# Patient Record
Sex: Female | Born: 1965 | Race: Black or African American | Hispanic: No | Marital: Married | Smoking: Never smoker
Health system: Southern US, Community
[De-identification: ages and names within clinical notes are randomized; demographics above are authoritative.]

## PROBLEM LIST (undated history)

## (undated) DIAGNOSIS — I10 Essential (primary) hypertension: Secondary | ICD-10-CM

## (undated) DIAGNOSIS — E119 Type 2 diabetes mellitus without complications: Secondary | ICD-10-CM

---

## 2008-05-20 ENCOUNTER — Emergency Department (HOSPITAL_COMMUNITY): Admission: EM | Admit: 2008-05-20 | Discharge: 2008-05-21 | Payer: Self-pay | Admitting: Emergency Medicine

## 2010-08-21 ENCOUNTER — Emergency Department (HOSPITAL_COMMUNITY)
Admission: EM | Admit: 2010-08-21 | Discharge: 2010-08-21 | Payer: Self-pay | Source: Home / Self Care | Admitting: Emergency Medicine

## 2012-06-03 ENCOUNTER — Emergency Department (HOSPITAL_COMMUNITY)
Admission: EM | Admit: 2012-06-03 | Discharge: 2012-06-03 | Disposition: A | Discharge status: Home / Self Care | Payer: Self-pay | Attending: Emergency Medicine | Admitting: Emergency Medicine

## 2012-06-03 ENCOUNTER — Encounter (HOSPITAL_COMMUNITY): Payer: Self-pay | Admitting: *Deleted

## 2012-06-03 DIAGNOSIS — I10 Essential (primary) hypertension: Secondary | ICD-10-CM | POA: Insufficient documentation

## 2012-06-03 DIAGNOSIS — L600 Ingrowing nail: Secondary | ICD-10-CM | POA: Insufficient documentation

## 2012-06-03 HISTORY — DX: Essential (primary) hypertension: I10

## 2012-06-03 MED ORDER — HYDROCODONE-ACETAMINOPHEN 5-325 MG PO TABS: 1.0000 | ORAL_TABLET | ORAL | Status: AC | PRN

## 2012-06-03 MED ORDER — CIPROFLOXACIN HCL 250 MG PO TABS
500.0000 mg | ORAL_TABLET | Freq: Once | ORAL | Status: AC
Start: 2012-06-03 — End: 2012-06-03
  Administered 2012-06-03: 500 mg via ORAL
  Filled 2012-06-03: qty 2

## 2012-06-03 MED ORDER — CIPROFLOXACIN HCL 500 MG PO TABS: 500.0000 mg | ORAL_TABLET | Freq: Two times a day (BID) | ORAL | Status: AC

## 2012-06-03 MED ORDER — IBUPROFEN 800 MG PO TABS
800.0000 mg | ORAL_TABLET | Freq: Once | ORAL | Status: AC
  Administered 2012-06-03: 800 mg via ORAL
  Filled 2012-06-03: qty 1

## 2012-06-03 MED ORDER — DOUBLE ANTIBIOTIC 500-10000 UNIT/GM EX OINT
TOPICAL_OINTMENT | Freq: Once | CUTANEOUS | Status: DC
Start: 2012-06-03 — End: 2012-06-04

## 2012-06-03 MED ORDER — LIDOCAINE HCL (PF) 2 % IJ SOLN
INTRAMUSCULAR | Status: AC
  Administered 2012-06-03: 10 mL
  Filled 2012-06-03: qty 10

## 2012-06-03 MED ORDER — BACITRACIN ZINC 500 UNIT/GM EX OINT
TOPICAL_OINTMENT | CUTANEOUS | Status: AC
  Administered 2012-06-03: 1
  Filled 2012-06-03: qty 0.9

## 2012-06-03 MED ORDER — LIDOCAINE HCL (PF) 2 % IJ SOLN
10.0000 mL | Freq: Once | INTRAMUSCULAR | Status: AC
  Administered 2012-06-03: 10 mL

## 2012-06-03 MED ORDER — ONDANSETRON HCL 4 MG PO TABS
4.0000 mg | ORAL_TABLET | Freq: Once | ORAL | Status: AC
  Administered 2012-06-03: 4 mg via ORAL
  Filled 2012-06-03: qty 1

## 2012-06-03 MED ORDER — HYDROCODONE-ACETAMINOPHEN 5-325 MG PO TABS
2.0000 | ORAL_TABLET | Freq: Once | ORAL | Status: AC
  Administered 2012-06-03: 2 via ORAL
  Filled 2012-06-03: qty 2

## 2012-06-03 NOTE — ED Notes (Signed)
Denies any injury to toe

## 2012-06-03 NOTE — ED Provider Notes (Signed)
History     CSN: [id number]  Arrival date & time 06/03/12  1810   First MD Initiated Contact with Patient 06/03/12 2106      Chief Complaint  Patient presents with  . Toe Pain    (Consider location/radiation/quality/duration/timing/severity/associated sxs/prior treatment) Patient is a 46 y.o. female presenting with toe pain. The history is provided by the patient.  Toe Pain This is a new problem. The current episode started in the past 7 days. The problem occurs daily. The problem has been gradually worsening. Pertinent negatives include no abdominal pain, arthralgias, chest pain, chills, coughing, fever, neck pain or numbness. The symptoms are aggravated by standing and walking. She has tried nothing for the symptoms. The treatment provided no relief.    Past Medical History  Diagnosis Date  . Hypertension     No past surgical history on file.  No family history on file.  History  Substance Use Topics  . Smoking status: Never Smoker   . Smokeless tobacco: Not on file  . Alcohol Use: Yes     Comment: occasional    OB History    Grav Para Term Preterm Abortions TAB SAB Ect Mult Living                  Review of Systems  Constitutional: Negative for fever, chills and activity change.       All ROS Neg except as noted in HPI  HENT: Negative for nosebleeds and neck pain.   Eyes: Negative for photophobia and discharge.  Respiratory: Negative for cough, shortness of breath and wheezing.   Cardiovascular: Negative for chest pain and palpitations.  Gastrointestinal: Negative for abdominal pain and blood in stool.  Genitourinary: Negative for dysuria, frequency and hematuria.  Musculoskeletal: Negative for back pain and arthralgias.  Skin: Negative.   Neurological: Negative for dizziness, seizures, speech difficulty and numbness.  Psychiatric/Behavioral: Negative for hallucinations and confusion.    Allergies  Amoxicillin and Tomato  Home Medications   Current  Outpatient Rx  Name  Route  Sig  Dispense  Refill  . ASPIRIN-ACETAMINOPHEN-CAFFEINE 250-250-65 MG PO TABS   Oral   Take 2 tablets by mouth once as needed. For pain           BP 163/98  Pulse 73  Temp 98.2 F (36.8 C) (Oral)  Resp 20  Ht 5\' 2"  (1.575 m)  Wt 198 lb (89.812 kg)  BMI 36.21 kg/m2  SpO2 100%  LMP 05/03/2012  Physical Exam  Nursing note and vitals reviewed. Constitutional: She is oriented to person, place, and time. She appears well-developed and well-nourished.  Non-toxic appearance.  HENT:  Head: Normocephalic.  Right Ear: Tympanic membrane and external ear normal.  Left Ear: Tympanic membrane and external ear normal.  Eyes: EOM and lids are normal. Pupils are equal, round, and reactive to light.  Neck: Normal range of motion. Neck supple. Carotid bruit is not present.  Cardiovascular: Normal rate, regular rhythm, normal heart sounds, intact distal pulses and normal pulses.   Pulmonary/Chest: Breath sounds normal. No respiratory distress.  Abdominal: Soft. Bowel sounds are normal. There is no tenderness. There is no guarding.  Musculoskeletal: Normal range of motion.       Tenderness and swelling of the medial right 1st toe. Ingrown nail noted. No drainage. No red steaks.  Lymphadenopathy:       Head (right side): No submandibular adenopathy present.       Head (left side): No submandibular adenopathy present.  She has no cervical adenopathy.  Neurological: She is alert and oriented to person, place, and time. She has normal strength. No cranial nerve deficit or sensory deficit.  Skin: Skin is warm and dry.  Psychiatric: She has a normal mood and affect. Her speech is normal.    ED Course  Procedures : REMOVAL OF INGROWN TOE NAIL - patient identified by arm band. Permission for the procedure is given by the patient. Procedural time out taken before removal of ingrown toenail of the right great toe. The right great toe was painted with alcohol. It was then  painted with Betadine. A digital block was carried out with 2% plain lidocaine. Following this using sterile technique the ingrown portion of the nail was removed with forceps, needle holder, and scissors. The site was washed out. There was minimal pus like material present. The patient had a Neosporin dressing applied. And the patient was fitted with a postop shoe. The patient tolerated the procedure without problem.  Labs Reviewed - No data to display No results found. Pulse Ox 100% on RA. WNL by my interpretation.  No diagnosis found.    MDM  I have reviewed nursing notes, vital signs, and all appropriate lab and imaging results for this patient. Pt had an ingrown nail with mild infection. Ingrown nail removed.  Pt placed on cipro  And Norco for pain. Pt to return if not improving.       Kathie Dike, [place] 06/04/12 [phone]

## 2012-06-03 NOTE — ED Notes (Signed)
Pt with pain to right great toe x 1 week, pain has gotten worse per pt., possible ingrown toe nail

## 2012-06-03 NOTE — ED Notes (Signed)
Pain to right great toe x1 week, pt denies injury, swelling and inflammation noted to inside of right great toe, pt denies drainage. No other complaints voiced.

## 2012-06-04 NOTE — ED Provider Notes (Signed)
Medical screening examination/treatment/procedure(s) were performed by non-physician practitioner and as supervising physician I was immediately available for consultation/collaboration.   Glynn Octave, MD 06/04/12 [phone]

## 2016-11-14 ENCOUNTER — Emergency Department (HOSPITAL_COMMUNITY)
Admission: EM | Admit: 2016-11-14 | Discharge: 2016-11-14 | Disposition: A | Discharge status: Home / Self Care | Payer: Self-pay | Attending: Emergency Medicine | Admitting: Emergency Medicine

## 2016-11-14 ENCOUNTER — Emergency Department (HOSPITAL_COMMUNITY): Payer: Self-pay

## 2016-11-14 ENCOUNTER — Encounter (HOSPITAL_COMMUNITY): Payer: Self-pay | Admitting: *Deleted

## 2016-11-14 DIAGNOSIS — I10 Essential (primary) hypertension: Secondary | ICD-10-CM | POA: Insufficient documentation

## 2016-11-14 DIAGNOSIS — M150 Primary generalized (osteo)arthritis: Secondary | ICD-10-CM

## 2016-11-14 DIAGNOSIS — Z79899 Other long term (current) drug therapy: Secondary | ICD-10-CM | POA: Insufficient documentation

## 2016-11-14 DIAGNOSIS — M1991 Primary osteoarthritis, unspecified site: Secondary | ICD-10-CM | POA: Insufficient documentation

## 2016-11-14 DIAGNOSIS — G5601 Carpal tunnel syndrome, right upper limb: Secondary | ICD-10-CM | POA: Insufficient documentation

## 2016-11-14 DIAGNOSIS — M159 Polyosteoarthritis, unspecified: Secondary | ICD-10-CM

## 2016-11-14 DIAGNOSIS — E119 Type 2 diabetes mellitus without complications: Secondary | ICD-10-CM | POA: Insufficient documentation

## 2016-11-14 HISTORY — DX: Type 2 diabetes mellitus without complications: E11.9

## 2016-11-14 MED ORDER — KETOROLAC TROMETHAMINE 10 MG PO TABS
10.0000 mg | ORAL_TABLET | Freq: Once | ORAL | Status: AC
  Administered 2016-11-14: 10 mg via ORAL
  Filled 2016-11-14: qty 1

## 2016-11-14 MED ORDER — TRAMADOL HCL 50 MG PO TABS
100.0000 mg | ORAL_TABLET | Freq: Once | ORAL | Status: AC
  Administered 2016-11-14: 100 mg via ORAL
  Filled 2016-11-14: qty 2

## 2016-11-14 MED ORDER — PROMETHAZINE HCL 12.5 MG PO TABS
12.5000 mg | ORAL_TABLET | Freq: Once | ORAL | Status: AC
  Administered 2016-11-14: 12.5 mg via ORAL
  Filled 2016-11-14: qty 1

## 2016-11-14 MED ORDER — DICLOFENAC SODIUM 75 MG PO TBEC: 75.0000 mg | DELAYED_RELEASE_TABLET | Freq: Two times a day (BID) | ORAL | 0 refills | Status: AC

## 2016-11-14 MED ORDER — TRAMADOL HCL 50 MG PO TABS: ORAL_TABLET | ORAL | 0 refills | Status: AC

## 2016-11-14 NOTE — ED Provider Notes (Signed)
[facility] Provider Note   CSN: [id number] Arrival date & time: 11/14/16  1757     History   Chief Complaint Chief Complaint  Patient presents with  . Wrist Pain    HPI Carol Bennett is a 51 y.o. female.  PCP Sidney Ace, MD   The history is provided by the patient.  Wrist Pain  This is a chronic problem. The current episode started 2 days ago. The problem occurs constantly. The problem has been gradually worsening. Pertinent negatives include no chest pain and no abdominal pain. Nothing aggravates the symptoms. Nothing relieves the symptoms. She has tried rest and a warm compress for the symptoms. The treatment provided no relief.    Past Medical History:  Diagnosis Date  . Diabetes mellitus without complication (HCC)   . Hypertension     There are no active problems to display for this patient.   History reviewed. No pertinent surgical history.  OB History    No data available       Home Medications    Prior to Admission medications   Medication Sig Start Date End Date Taking? Authorizing Provider  aspirin-acetaminophen-caffeine (EXCEDRIN MIGRAINE) [phone] MG per tablet Take 2 tablets by mouth once as needed. For pain    Historical Provider, MD  ciprofloxacin (CIPRO) 500 MG tablet Take 1 tablet (500 mg total) by mouth 2 (two) times daily. 06/03/12   Ivery Quale, PA-C    Family History No family history on file.  Social History Social History  Substance Use Topics  . Smoking status: Never Smoker  . Smokeless tobacco: Never Used  . Alcohol use Yes     Comment: occasional     Allergies   Amoxicillin and Tomato   Review of Systems Review of Systems  Cardiovascular: Negative for chest pain.  Gastrointestinal: Negative for abdominal pain.  Musculoskeletal: Positive for arthralgias.  All other systems reviewed and are negative.    Physical Exam Updated Vital Signs BP (!) 176/93 (BP Location: Left Arm)   Pulse 84   Temp  98.2 F (36.8 C) (Oral)   Resp 18   Ht  (1.575 m)   Wt 83 kg   LMP 08/16/2016   SpO2 100%   BMI 33.47 kg/m   Physical Exam  Constitutional: She is oriented to person, place, and time. She appears well-developed and well-nourished.  Non-toxic appearance.  HENT:  Head: Normocephalic.  Right Ear: Tympanic membrane and external ear normal.  Left Ear: Tympanic membrane and external ear normal.  Eyes: EOM and lids are normal. Pupils are equal, round, and reactive to light.  Neck: Normal range of motion. Neck supple. Carotid bruit is not present.  Cardiovascular: Normal rate, regular rhythm, normal heart sounds, intact distal pulses and normal pulses.   Pulmonary/Chest: Breath sounds normal. No respiratory distress.  Abdominal: Soft. Bowel sounds are normal. There is no tenderness. There is no guarding.  Musculoskeletal:       Right shoulder: She exhibits normal range of motion and no tenderness.       Right elbow: She exhibits normal range of motion and no effusion. No tenderness found.       Right wrist: She exhibits decreased range of motion and tenderness. She exhibits no deformity.  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 Positive Tenel'S sign.  Lymphadenopathy:       Head (right side): No submandibular adenopathy present.       Head (left side): No submandibular adenopathy present.  She has no cervical adenopathy.  Neurological: She is alert and oriented to person, place, and time. She has normal strength. No cranial nerve deficit or sensory deficit.  Skin: Skin is warm and dry.  Psychiatric: She has a normal mood and affect. Her speech is normal.  Nursing note and vitals reviewed.    ED Treatments / Results  Labs (all labs ordered are listed, but only abnormal results are displayed) Labs Reviewed - No data to display  EKG  EKG Interpretation None       Radiology No results found.  Procedures Procedures (including critical care time)  Velcro wrist splint  applied by nursing staff. After the splint was applied, the patient was noted to have capillary refill less than 2 seconds. No temperature changes on. The pain slightly improved with the wrist splint. Medications Ordered in ED Medications - No data to display   Initial Impression / Assessment and Plan / ED Course  I have reviewed the triage vital signs and the nursing notes.  Pertinent labs & imaging results that were available during my care of the patient were reviewed by me and considered in my medical decision making (see chart for details).     **I have reviewed nursing notes, vital signs, and all appropriate lab and imaging results for this patient.*  Final Clinical Impressions(s) / ED Diagnoses MDM Vital signs within normal limits with exception of the blood pressure being elevated at 176/93. No gross neurovascular deficits appreciated. X-ray of the right wrist is negative for fracture or dislocation or malalignment. The examination suggest osteoarthritis and carpal tunnel syndrome. The patient is fitted with a wrist splint. She will be treated with diclofenac and Ultram. The patient is referred to orthopedics for additional evaluation and management of her carpal syndrome.    Final diagnoses:  Carpal tunnel syndrome of right wrist  Primary osteoarthritis involving multiple joints    New Prescriptions Discharge Medication List as of 11/14/2016  6:43 PM    START taking these medications   Details  diclofenac (VOLTAREN) 75 MG EC tablet Take 1 tablet (75 mg total) by mouth 2 (two) times daily., Starting Tue 11/14/2016, Print    traMADol (ULTRAM) 50 MG tablet 1 or 2 po q6h prn pain, Print         Ivery Quale, PA-C 11/15/16 0012    Samuel Jester, DO 11/19/16 1120

## 2016-11-14 NOTE — ED Triage Notes (Signed)
Pt comes in with right wrist pain that is chronic with worsening over the last 2 days. Denies any injury. NAD noted.

## 2016-11-14 NOTE — Discharge Instructions (Signed)
Please usual wrist splint until seen by orthopedics. Please see Dr. Romeo Apple, or the orthopedic specialist of your choice for orthopedic management of your carpal tunnel. Your examination also suggest degenerative joint changes, or osteoarthritis. Please use diclofenac 2 times daily with food. May use Ultram every 6 hours as needed for pain. This medication may cause drowsiness. Please do not drink, drive, or participate in activity that requires concentration while taking this medication.

## 2016-12-01 ENCOUNTER — Encounter (HOSPITAL_COMMUNITY): Payer: Self-pay | Admitting: Emergency Medicine

## 2016-12-01 ENCOUNTER — Emergency Department (HOSPITAL_COMMUNITY)
Admission: EM | Admit: 2016-12-01 | Discharge: 2016-12-01 | Disposition: A | Discharge status: Home / Self Care | Payer: Medicaid - Out of State | Attending: Emergency Medicine | Admitting: Emergency Medicine

## 2016-12-01 ENCOUNTER — Emergency Department (HOSPITAL_COMMUNITY): Payer: Medicaid - Out of State

## 2016-12-01 DIAGNOSIS — M25461 Effusion, right knee: Secondary | ICD-10-CM | POA: Insufficient documentation

## 2016-12-01 DIAGNOSIS — Z7982 Long term (current) use of aspirin: Secondary | ICD-10-CM | POA: Insufficient documentation

## 2016-12-01 DIAGNOSIS — Z79899 Other long term (current) drug therapy: Secondary | ICD-10-CM | POA: Insufficient documentation

## 2016-12-01 DIAGNOSIS — E119 Type 2 diabetes mellitus without complications: Secondary | ICD-10-CM | POA: Insufficient documentation

## 2016-12-01 DIAGNOSIS — I10 Essential (primary) hypertension: Secondary | ICD-10-CM | POA: Insufficient documentation

## 2016-12-01 LAB — POC URINE PREG, ED: Preg Test, Ur: NEGATIVE

## 2016-12-01 MED ORDER — HYDROCODONE-ACETAMINOPHEN 5-325 MG PO TABS: 2.0000 | ORAL_TABLET | ORAL | 0 refills | Status: AC | PRN

## 2016-12-01 MED ORDER — HYDROCODONE-ACETAMINOPHEN 5-325 MG PO TABS
1.0000 | ORAL_TABLET | Freq: Once | ORAL | Status: AC
  Administered 2016-12-01: 1 via ORAL
  Filled 2016-12-01: qty 1

## 2016-12-01 NOTE — ED Provider Notes (Signed)
[facility] Provider Note   CSN: [id number] Arrival date & time: 12/01/16  1328     History   Chief Complaint Chief Complaint  Patient presents with  . Knee Pain    HPI Carol Bennett is a 51 y.o. female past medical history of osteoarthritis to right knee who presents with 2 weeks of persistent right knee pain. Patient has a known history of arthritis to her right knee but states she is not on any chronic medication. She states that pain feels similar to past episodes of arthritis and knee but states that it is been persisting for 2 weeks. She has taken ibuprofen with no improvement in pain.  She denies any preceding trauma or injury to right knee. She's been using a knee sleeve with some mild relief in pain. Patient states that pain is worsened with ambulation and bearing weight or movement of the leg. She denies any other alleviating factors. She denies any warmth or redness to the leg. Denies any history of blood clots in her legs or lungs, recent immobilization, recent surgery, recent travel. She denies any fever, trauma/injury, nausea/vomiting, no numbness/weakness of her legs or any other worsening or concerning symptoms.  The history is provided by the patient.    Past Medical History:  Diagnosis Date  . Diabetes mellitus without complication (HCC)   . Hypertension     There are no active problems to display for this patient.   History reviewed. No pertinent surgical history.  OB History    No data available       Home Medications    Prior to Admission medications   Medication Sig Start Date End Date Taking? Authorizing Provider  aspirin-acetaminophen-caffeine (EXCEDRIN MIGRAINE) [phone] MG per tablet Take 2 tablets by mouth once as needed. For pain    [provider]  ciprofloxacin (CIPRO) 500 MG tablet Take 1 tablet (500 mg total) by mouth 2 (two) times daily. 06/03/12   Ivery Quale, PA-C  diclofenac (VOLTAREN) 75 MG EC tablet Take 1  tablet (75 mg total) by mouth 2 (two) times daily. 11/14/16   Ivery Quale, PA-C  HYDROcodone-acetaminophen (NORCO/VICODIN) 5-325 MG tablet Take 2 tablets by mouth every 4 (four) hours as needed. 12/01/16   Maxwell Caul, PA-C  traMADol Janean Sark) 50 MG tablet 1 or 2 po q6h prn pain 11/14/16   Ivery Quale, PA-C    Family History History reviewed. No pertinent family history.  Social History Social History  Substance Use Topics  . Smoking status: Never Smoker  . Smokeless tobacco: Never Used  . Alcohol use Yes     Comment: occasional     Allergies   Amoxicillin and Tomato   Review of Systems Review of Systems  Constitutional: Negative for fever.  Respiratory: Negative for shortness of breath.   Cardiovascular: Negative for chest pain.  Gastrointestinal: Negative for abdominal pain, constipation, nausea and vomiting.  Musculoskeletal: Positive for arthralgias (right knee).  Skin: Negative for color change.  Neurological: Negative for weakness and numbness.  All other systems reviewed and are negative.    Physical Exam Updated Vital Signs BP (!) 172/89 (BP Location: Right Arm)   Pulse 79   Temp 97.6 F (36.4 C) (Oral)   Resp 18   SpO2 98%   Physical Exam  Constitutional: She appears well-developed and well-nourished.  Sitting comfortably in place with knee sleeve on right knee.  HENT:  Head: Normocephalic and atraumatic.  Eyes: Conjunctivae and EOM are normal. Right eye exhibits no  discharge. Left eye exhibits no discharge. No scleral icterus.  Pulmonary/Chest: Effort normal.  Musculoskeletal: She exhibits no deformity.  Right knee with diffuse tenderness to the posterior and lateral aspect. No deformity, mass, crepitus palpated. Negative anterior and posterior drawer test. No instability with valgus or varus stress. Some difficulty diffuse soft tissue swelling to the patellar region. Full extension of right knee intact without any difficulty. Good flexion but  with subjective reports of pain. Normal left knee.  Neurological: She is alert.  Skin: Skin is warm and dry. Capillary refill takes less than 2 seconds.  No overlying warmth, erythema, ecchymosis overlying the right knee.  Psychiatric: She has a normal mood and affect. Her speech is normal and behavior is normal.     ED Treatments / Results  Labs (all labs ordered are listed, but only abnormal results are displayed) Labs Reviewed  POC URINE PREG, ED    EKG  EKG Interpretation None       Radiology Dg Knee Complete 4 Views Right  Result Date: 12/01/2016 CLINICAL DATA:  RIGHT knee pain. EXAM: RIGHT KNEE - COMPLETE 4+ VIEW COMPARISON:  None. FINDINGS: No evidence of fracture, or dislocation. Positive for joint effusion. No evidence of arthropathy or other focal bone abnormality. Soft tissues are unremarkable. IMPRESSION: Joint effusion.  No fracture or dislocation. Electronically Signed   By: Elsie StainJohn T Curnes M.D.   On: 12/01/2016 14:51    Procedures Procedures (including critical care time)  Medications Ordered in ED Medications  HYDROcodone-acetaminophen (NORCO/VICODIN) 5-325 MG per tablet 1 tablet (1 tablet Oral Given 12/01/16 1530)     Initial Impression / Assessment and Plan / ED Course  I have reviewed the triage vital signs and the nursing notes.  Pertinent labs & imaging results that were available during my care of the patient were reviewed by me and considered in my medical decision making (see chart for details).     51 year old female with past medical history of osteoarthritis to right knee. Presents with right knee pain 2 weeks. Improvement with ibuprofen use. Physical exam shows tenderness to palpation to the lateral and posterior aspect of the knee. She does have some very mild soft tissue swelling to the knee but no overlying warmth, erythema, ecchymosis. Patient is afebrile, non-toxic appearing, sitting comfortably on examination table. Consider worsening of her  osteoarthritis to right knee. History/physical exam are not concerning for a septic arthritis. Also consider knee sprain versus fracture, the low suspicion of fracture given lack of trauma and history/physical exam. Will obtain x-ray to evaluate for any fracture or dislocation or effusion. Patient provided pain medication department.  X-ray reviewed. Negative for any fracture or dislocation. There is a small effusion noted which is consistent with a very small amount of soft tissue swelling felt. Given that patient has no overlying warmth, erythema in the history/physical are not concerning for a septic joint, we will not perform arthrocentesis at this time. Will treat mainly with supportive care. Discussed results with patient. She is currently not working secondary to a carpal tunnel injury and will be able to rest and treat the knee with supportive care. She is currently being seen by an orthopedic doctor for her carpal tunnel. Will give her orthopedic referral in case she needs it. We'll give a short course of pain medications to help with pain. Instructed patient to also take NSAIDs if she does not want to take pain medications. Strict return precautions discussed. Patient expresses understanding and agreement to plan  Final Clinical  Impressions(s) / ED Diagnoses   Final diagnoses:  Knee effusion, right    New Prescriptions Discharge Medication List as of 12/01/2016  3:27 PM    START taking these medications   Details  HYDROcodone-acetaminophen (NORCO/VICODIN) 5-325 MG tablet Take 2 tablets by mouth every 4 (four) hours as needed., Starting Fri 12/01/2016, Print         Maxwell Caul, PA-C 12/01/16 1811    Lavera Guise, MD 12/02/16 [phone]

## 2016-12-01 NOTE — Discharge Instructions (Signed)
Take ibuprofen as needed for pain. You can also take the prescribed pain medication to help with pain. Wear the knee sleeve for stabilization support. Apply ice to help with relief of the pain.  Follow-up with her primary care doctor in the next 24-48 hours.  Follow-up with referred orthopedic doctor in a week if there are no improvement in symptoms.  Return the emergency Department for any fever, redness/swelling of the knee, warmth of the knee, worsening pain or any other worsening or concerning symptoms.

## 2016-12-01 NOTE — ED Triage Notes (Signed)
Pt reports arthritis in knee- here today for knee pain with " a knot" on the side of her knee  J[facility] is PCP

## 2016-12-01 NOTE — ED Triage Notes (Signed)
Pt c/o chronic right knee pain x 2 weeks. Swelling noted to lateral area of knee. Denies new injury. nad.

## 2017-06-11 ENCOUNTER — Other Ambulatory Visit: Payer: Self-pay

## 2017-06-11 ENCOUNTER — Emergency Department (HOSPITAL_COMMUNITY)
Admission: EM | Admit: 2017-06-11 | Discharge: 2017-06-11 | Disposition: A | Discharge status: Home / Self Care | Payer: Medicaid - Out of State | Attending: Emergency Medicine | Admitting: Emergency Medicine

## 2017-06-11 ENCOUNTER — Encounter (HOSPITAL_COMMUNITY): Payer: Self-pay

## 2017-06-11 DIAGNOSIS — I10 Essential (primary) hypertension: Secondary | ICD-10-CM | POA: Insufficient documentation

## 2017-06-11 DIAGNOSIS — H1031 Unspecified acute conjunctivitis, right eye: Secondary | ICD-10-CM

## 2017-06-11 DIAGNOSIS — Z79899 Other long term (current) drug therapy: Secondary | ICD-10-CM | POA: Diagnosis not present

## 2017-06-11 DIAGNOSIS — E119 Type 2 diabetes mellitus without complications: Secondary | ICD-10-CM | POA: Diagnosis not present

## 2017-06-11 DIAGNOSIS — H5711 Ocular pain, right eye: Secondary | ICD-10-CM | POA: Diagnosis present

## 2017-06-11 MED ORDER — TOBRAMYCIN 0.3 % OP SOLN: 2.0000 [drp] | OPHTHALMIC | 0 refills | Status: AC

## 2017-06-11 NOTE — ED Provider Notes (Signed)
[facility] Provider Note   CSN: [id number] Arrival date & time: 06/11/17  1036     History   Chief Complaint Chief Complaint  Patient presents with  . Eye Problem    HPI Carol [facility] is a 51 y.o. female.  The history is provided by the patient. No language interpreter was used.  Eye Problem   This is a new problem. The current episode started yesterday. The problem occurs constantly. The problem has been gradually worsening. There was no injury mechanism. The pain is moderate. There is no history of trauma to the eye. There is known exposure to pink eye. She does not wear contacts. Associated symptoms include discharge. She has tried nothing for the symptoms. The treatment provided no relief.   Pt reports he niece had pink eye Past Medical History:  Diagnosis Date  . Diabetes mellitus without complication (HCC)   . Hypertension     There are no active problems to display for this patient.   History reviewed. No pertinent surgical history.  OB History    No data available       Home Medications    Prior to Admission medications   Medication Sig Start Date End Date Taking? Authorizing Provider  aspirin-acetaminophen-caffeine (EXCEDRIN MIGRAINE) [phone] MG per tablet Take 2 tablets by mouth once as needed. For pain    [provider]  ciprofloxacin (CIPRO) 500 MG tablet Take 1 tablet (500 mg total) by mouth 2 (two) times daily. 06/03/12   Ivery QualeBryant, Hobson, PA-C  diclofenac (VOLTAREN) 75 MG EC tablet Take 1 tablet (75 mg total) by mouth 2 (two) times daily. 11/14/16   Ivery QualeBryant, Hobson, PA-C  HYDROcodone-acetaminophen (NORCO/VICODIN) 5-325 MG tablet Take 2 tablets by mouth every 4 (four) hours as needed. 12/01/16   Maxwell CaulLayden, Lindsey A, PA-C  tobramycin (TOBREX) 0.3 % ophthalmic solution Place 2 drops every 4 (four) hours into the right eye. 06/11/17   Elson AreasSofia, Aza Dantes K, PA-C  traMADol Janean Sark(ULTRAM) 50 MG tablet 1 or 2 po q6h prn pain 11/14/16    Ivery QualeBryant, Hobson, PA-C    Family History No family history on file.  Social History Social History   Tobacco Use  . Smoking status: Never Smoker  . Smokeless tobacco: Never Used  Substance Use Topics  . Alcohol use: Yes    Comment: occasional  . Drug use: No     Allergies   Amoxicillin and Tomato   Review of Systems Review of Systems  Eyes: Positive for pain and discharge.  All other systems reviewed and are negative.    Physical Exam Updated Vital Signs BP (!) 187/100 (BP Location: Right Arm)   Pulse 74   Temp 98.3 F (36.8 C) (Oral)   Resp 16   Ht 5\' 2"  (1.575 m)   Wt 84.4 kg (186 lb)   SpO2 96%   BMI 34.02 kg/m   Physical Exam  Constitutional: She is oriented to person, place, and time. She appears well-developed and well-nourished.  HENT:  Head: Normocephalic.  Eyes: EOM are normal. Pupils are equal, round, and reactive to light.  Injected conjunctiva right eye   Neck: Normal range of motion.  Cardiovascular: Normal rate.  Pulmonary/Chest: Effort normal.  Abdominal: She exhibits no distension.  Musculoskeletal: Normal range of motion.  Neurological: She is alert and oriented to person, place, and time.  Psychiatric: She has a normal mood and affect.  Nursing note and vitals reviewed.    ED Treatments / Results  Labs (all  labs ordered are listed, but only abnormal results are displayed) Labs Reviewed - No data to display  EKG  EKG Interpretation None       Radiology No results found.  Procedures Procedures (including critical care time)  Medications Ordered in ED Medications - No data to display   Initial Impression / Assessment and Plan / ED Course  I have reviewed the triage vital signs and the nursing notes.  Pertinent labs & imaging results that were available during my care of the patient were reviewed by me and considered in my medical decision making (see chart for details).       Final Clinical Impressions(s) / ED  Diagnoses   Final diagnoses:  Acute conjunctivitis of right eye, unspecified acute conjunctivitis type    ED Discharge Orders        Ordered    tobramycin (TOBREX) 0.3 % ophthalmic solution  Every 4 hours     06/11/17 1140    An After Visit Summary was printed and given to the patient.    Elson AreasSofia, Maitland [facility] K, PA-C 06/11/17 1144    Vanetta MuldersZackowski, Scott, MD 06/20/17 1601

## 2017-06-11 NOTE — ED Triage Notes (Signed)
Right eye drainage, itching and pain since yesterday.

## 2017-06-11 NOTE — Discharge Instructions (Signed)
Return if any problems.

## 2017-08-02 IMAGING — DX DG KNEE COMPLETE 4+V*R*
4 series · 4 of 4 positions shown · non-contrast
Comparison: None.

CLINICAL DATA: RIGHT knee pain.

EXAM:
RIGHT KNEE - COMPLETE 4+ VIEW

[knee ap]
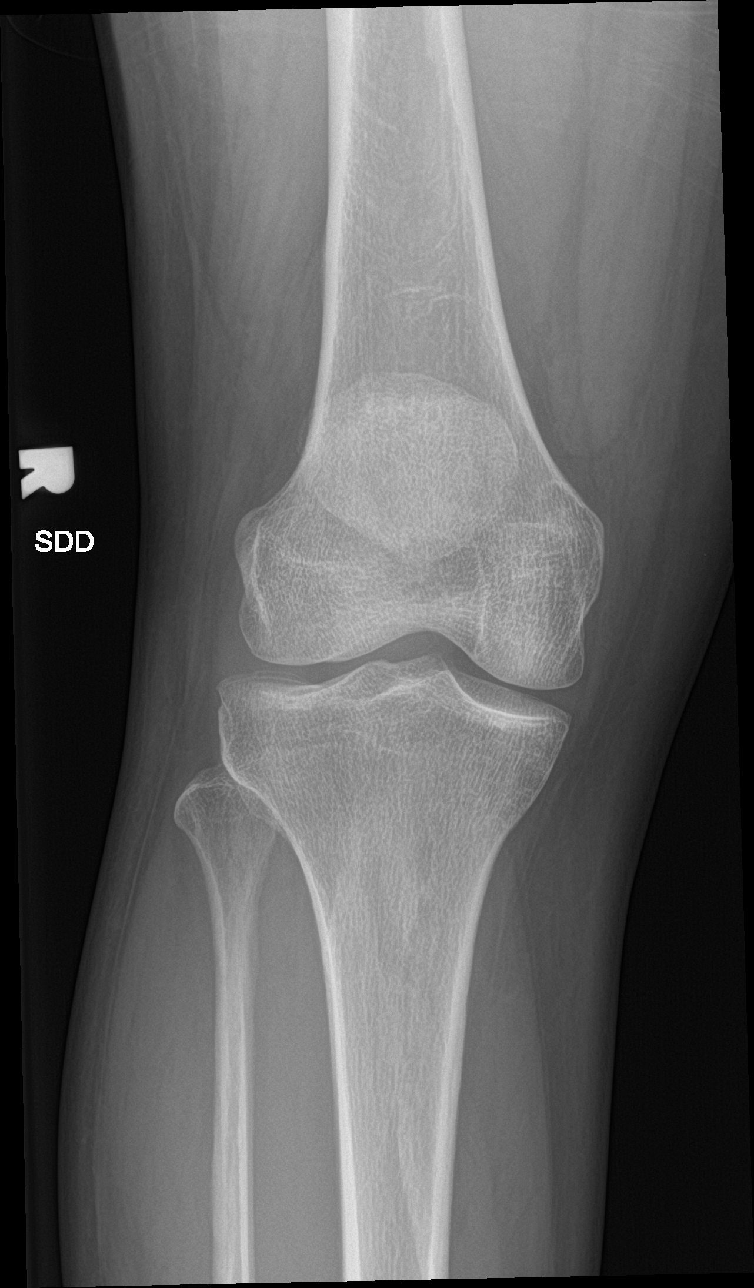

[tunnel]
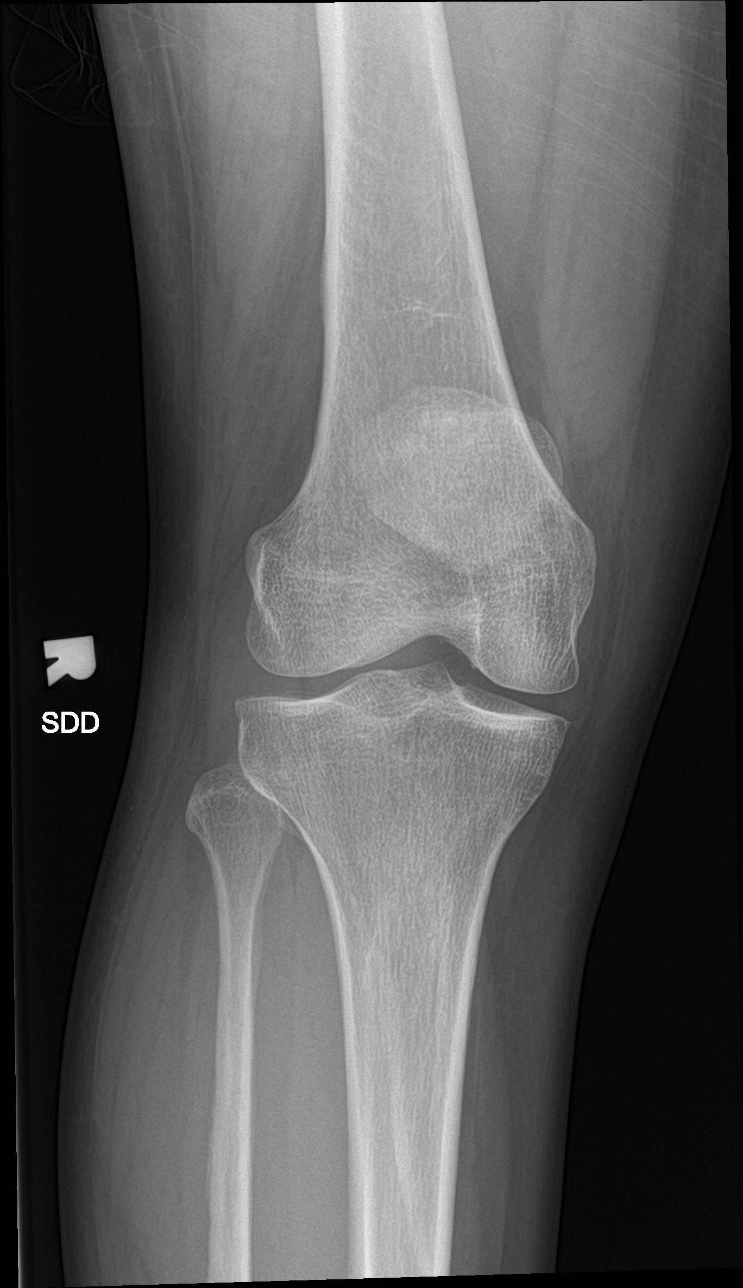

[knee lat]
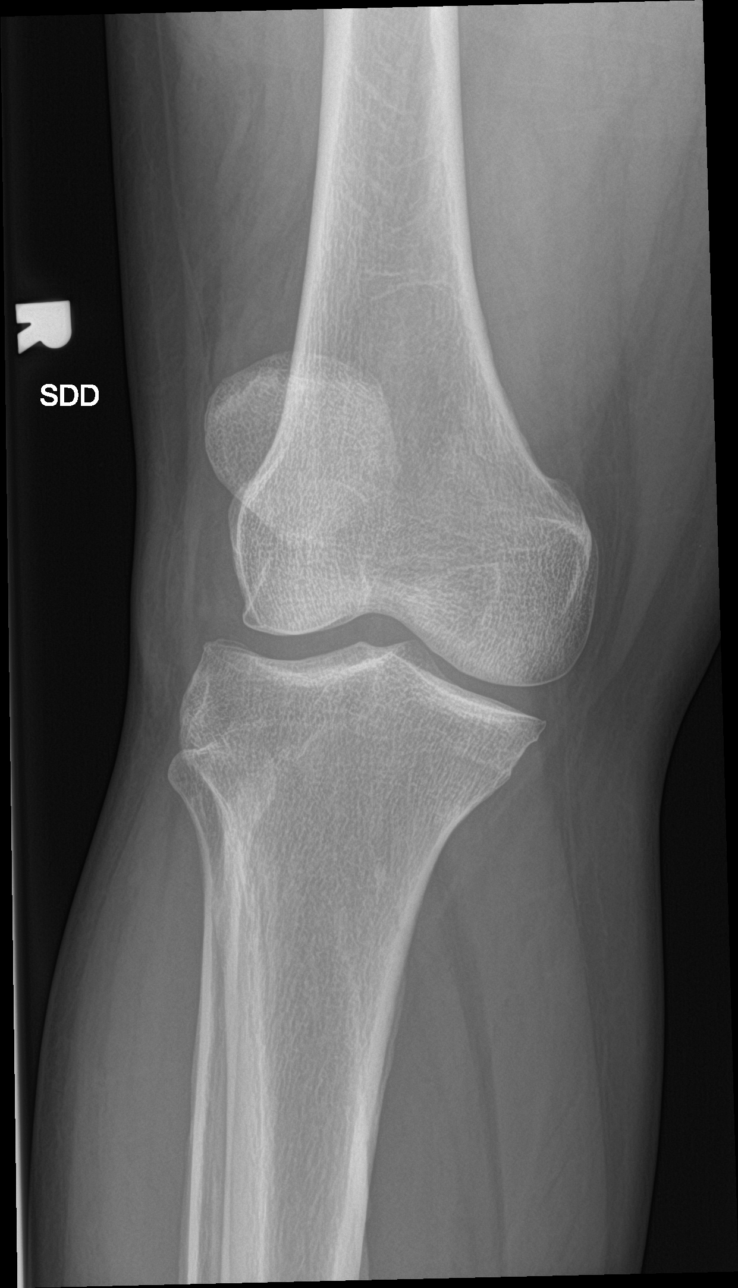

[knee sunrise]
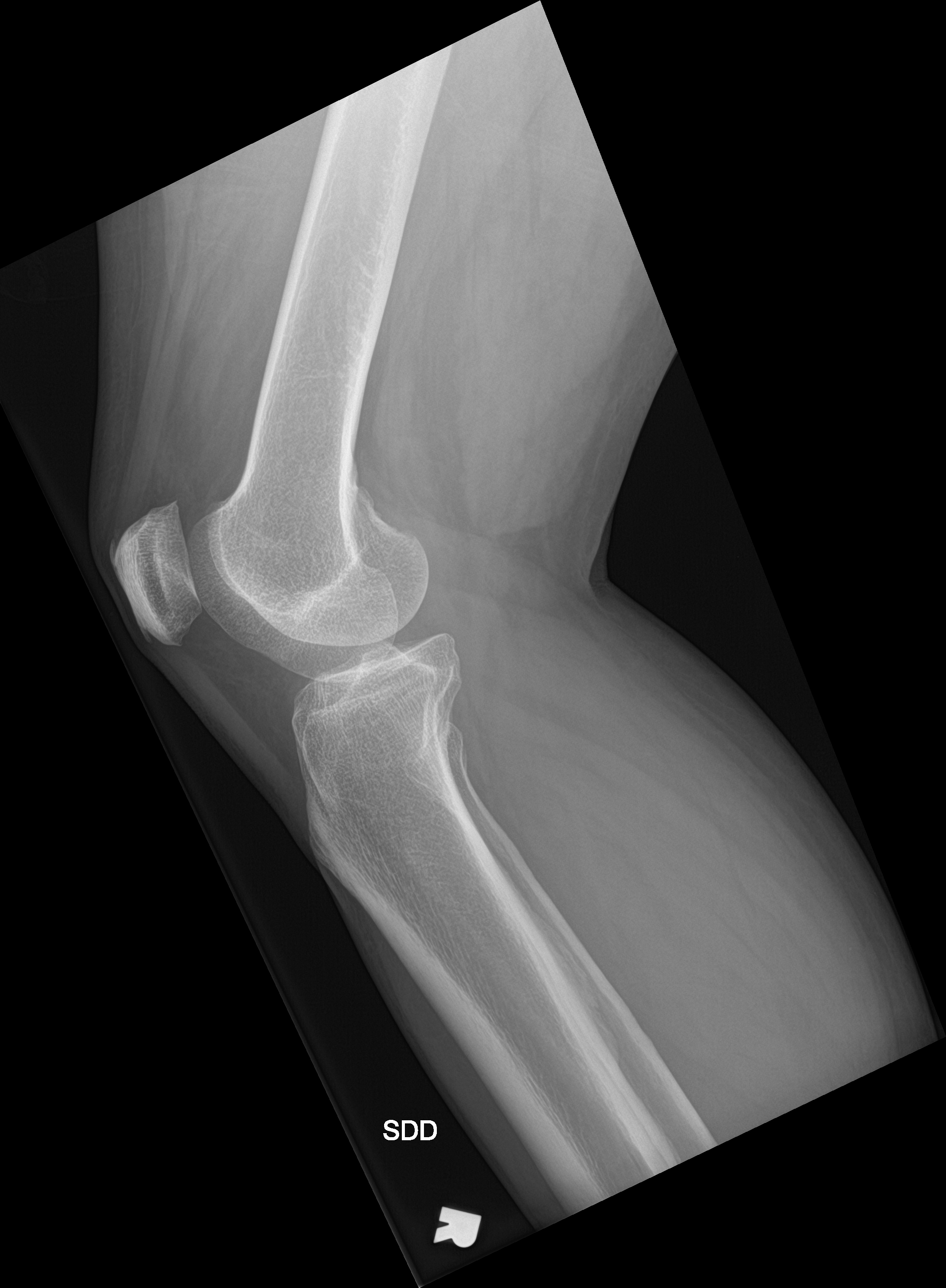

[4 of 4 positions shown; findings below may reference images not displayed]

FINDINGS: No evidence of fracture, or dislocation. Positive for joint
effusion. No evidence of arthropathy or other focal bone
abnormality. Soft tissues are unremarkable.
IMPRESSION: Joint effusion.  No fracture or dislocation.
